# Patient Record
Sex: Female | Born: 1960 | Race: White | Hispanic: No | Marital: Married | Smoking: Never smoker
Health system: Southern US, Community
[De-identification: ages and names within clinical notes are randomized; demographics above are authoritative.]

## PROBLEM LIST (undated history)

## (undated) DIAGNOSIS — O1490 Unspecified pre-eclampsia, unspecified trimester: Secondary | ICD-10-CM

## (undated) HISTORY — PX: ABDOMINAL HYSTERECTOMY: SHX81

---

## 1999-10-13 ENCOUNTER — Other Ambulatory Visit: Admission: RE | Admit: 1999-10-13 | Discharge: 1999-10-13 | Payer: Self-pay | Admitting: Gynecology

## 1999-10-13 ENCOUNTER — Encounter: Payer: Self-pay | Admitting: Gynecology

## 1999-10-13 ENCOUNTER — Encounter: Admission: RE | Admit: 1999-10-13 | Discharge: 1999-10-13 | Payer: Self-pay | Admitting: Gynecology

## 2002-08-23 ENCOUNTER — Encounter: Admission: RE | Admit: 2002-08-23 | Discharge: 2002-08-23 | Payer: Self-pay | Admitting: Gynecology

## 2002-08-23 ENCOUNTER — Other Ambulatory Visit: Admission: RE | Admit: 2002-08-23 | Discharge: 2002-08-23 | Payer: Self-pay | Admitting: Gynecology

## 2002-08-23 ENCOUNTER — Encounter: Payer: Self-pay | Admitting: Gynecology

## 2005-01-27 ENCOUNTER — Ambulatory Visit: Payer: Self-pay | Admitting: Pulmonary Disease

## 2005-02-11 ENCOUNTER — Ambulatory Visit: Payer: Self-pay | Admitting: Internal Medicine

## 2005-03-01 ENCOUNTER — Ambulatory Visit: Payer: Self-pay | Admitting: Pulmonary Disease

## 2005-03-09 ENCOUNTER — Ambulatory Visit: Payer: Self-pay | Admitting: Gastroenterology

## 2005-07-20 ENCOUNTER — Other Ambulatory Visit: Admission: RE | Admit: 2005-07-20 | Discharge: 2005-07-20 | Payer: Self-pay | Admitting: Gynecology

## 2005-07-29 ENCOUNTER — Encounter: Admission: RE | Admit: 2005-07-29 | Discharge: 2005-07-29 | Payer: Self-pay | Admitting: Gynecology

## 2005-09-28 ENCOUNTER — Ambulatory Visit: Payer: Self-pay | Admitting: Pulmonary Disease

## 2006-11-29 ENCOUNTER — Ambulatory Visit: Payer: Self-pay | Admitting: Pulmonary Disease

## 2007-06-30 ENCOUNTER — Telehealth (INDEPENDENT_AMBULATORY_CARE_PROVIDER_SITE_OTHER): Payer: Self-pay | Admitting: *Deleted

## 2007-09-14 ENCOUNTER — Ambulatory Visit: Payer: Self-pay | Admitting: Pulmonary Disease

## 2007-09-14 DIAGNOSIS — F411 Generalized anxiety disorder: Secondary | ICD-10-CM | POA: Insufficient documentation

## 2007-09-14 DIAGNOSIS — IMO0001 Reserved for inherently not codable concepts without codable children: Secondary | ICD-10-CM

## 2007-09-14 DIAGNOSIS — K649 Unspecified hemorrhoids: Secondary | ICD-10-CM | POA: Insufficient documentation

## 2007-09-14 DIAGNOSIS — K589 Irritable bowel syndrome without diarrhea: Secondary | ICD-10-CM

## 2007-09-28 ENCOUNTER — Telehealth: Payer: Self-pay | Admitting: Adult Health

## 2007-10-05 ENCOUNTER — Ambulatory Visit: Payer: Self-pay | Admitting: Pulmonary Disease

## 2007-10-05 ENCOUNTER — Encounter: Payer: Self-pay | Admitting: Adult Health

## 2007-10-17 ENCOUNTER — Ambulatory Visit: Payer: Self-pay | Admitting: Pulmonary Disease

## 2007-10-17 ENCOUNTER — Encounter: Payer: Self-pay | Admitting: Adult Health

## 2007-10-17 DIAGNOSIS — G4700 Insomnia, unspecified: Secondary | ICD-10-CM | POA: Insufficient documentation

## 2007-10-20 LAB — CONVERTED CEMR LAB
ALT: 16 units/L (ref 0–35)
AST: 18 units/L (ref 0–37)
Albumin: 3.5 g/dL (ref 3.5–5.2)
Alkaline Phosphatase: 54 units/L (ref 39–117)
BUN: 12 mg/dL (ref 6–23)
Bacteria, UA: NEGATIVE
Basophils Absolute: 0 10*3/uL (ref 0.0–0.1)
Basophils Relative: 0.1 % (ref 0.0–1.0)
Bilirubin, Direct: 0.1 mg/dL (ref 0.0–0.3)
CO2: 29 meq/L (ref 19–32)
Calcium: 9 mg/dL (ref 8.4–10.5)
Chloride: 109 meq/L (ref 96–112)
Creatinine, Ser: 0.8 mg/dL (ref 0.4–1.2)
Crystals: NEGATIVE
Eosinophils Absolute: 0.1 10*3/uL (ref 0.0–0.7)
Eosinophils Relative: 1.5 % (ref 0.0–5.0)
Folate: 7.1 ng/mL
GFR calc Af Amer: 99 mL/min
GFR calc non Af Amer: 82 mL/min
Glucose, Bld: 93 mg/dL (ref 70–99)
HCT: 37.4 % (ref 36.0–46.0)
Hemoglobin, Urine: NEGATIVE
Hemoglobin: 13 g/dL (ref 12.0–15.0)
Iron: 85 ug/dL (ref 42–145)
Ketones, ur: NEGATIVE mg/dL
Leukocytes, UA: NEGATIVE
Lymphocytes Relative: 38.1 % (ref 12.0–46.0)
MCHC: 34.8 g/dL (ref 30.0–36.0)
MCV: 91.2 fL (ref 78.0–100.0)
Monocytes Absolute: 0.5 10*3/uL (ref 0.1–1.0)
Monocytes Relative: 6.5 % (ref 3.0–12.0)
Mucus, UA: NEGATIVE
Neutro Abs: 3.8 10*3/uL (ref 1.4–7.7)
Neutrophils Relative %: 53.8 % (ref 43.0–77.0)
Nitrite: NEGATIVE
Platelets: 404 10*3/uL — ABNORMAL HIGH (ref 150–400)
Potassium: 3.9 meq/L (ref 3.5–5.1)
RBC / HPF: NONE SEEN
RBC: 4.1 M/uL (ref 3.87–5.11)
RDW: 12.9 % (ref 11.5–14.6)
Saturation Ratios: 23.8 % (ref 20.0–50.0)
Sodium: 141 meq/L (ref 135–145)
Specific Gravity, Urine: >=1.03 (ref 1.000–1.03)
TSH: 5.34 microintl units/mL (ref 0.35–5.50)
Total Bilirubin: 0.6 mg/dL (ref 0.3–1.2)
Total Protein, Urine: NEGATIVE mg/dL
Total Protein: 6.8 g/dL (ref 6.0–8.3)
Transferrin: 255.1 mg/dL (ref >=212.0)
Urine Glucose: NEGATIVE mg/dL
Urobilinogen, UA: 0.2 (ref 0.0–1.0)
Vitamin B-12: 264 pg/mL (ref 211–911)
WBC: 7.1 10*3/uL (ref 4.5–10.5)
pH: 5.5 (ref 5.0–8.0)

## 2008-02-16 ENCOUNTER — Ambulatory Visit: Payer: Self-pay | Admitting: Pulmonary Disease

## 2008-02-18 DIAGNOSIS — N809 Endometriosis, unspecified: Secondary | ICD-10-CM | POA: Insufficient documentation

## 2008-02-18 DIAGNOSIS — J45991 Cough variant asthma: Secondary | ICD-10-CM

## 2008-02-18 DIAGNOSIS — E669 Obesity, unspecified: Secondary | ICD-10-CM

## 2008-02-18 DIAGNOSIS — E559 Vitamin D deficiency, unspecified: Secondary | ICD-10-CM | POA: Insufficient documentation

## 2008-02-18 DIAGNOSIS — J309 Allergic rhinitis, unspecified: Secondary | ICD-10-CM | POA: Insufficient documentation

## 2008-02-18 DIAGNOSIS — J189 Pneumonia, unspecified organism: Secondary | ICD-10-CM

## 2008-05-21 ENCOUNTER — Telehealth (INDEPENDENT_AMBULATORY_CARE_PROVIDER_SITE_OTHER): Payer: Self-pay | Admitting: *Deleted

## 2008-05-22 ENCOUNTER — Ambulatory Visit: Payer: Self-pay | Admitting: Pulmonary Disease

## 2008-05-27 ENCOUNTER — Telehealth (INDEPENDENT_AMBULATORY_CARE_PROVIDER_SITE_OTHER): Payer: Self-pay | Admitting: *Deleted

## 2008-05-27 ENCOUNTER — Ambulatory Visit: Payer: Self-pay | Admitting: Internal Medicine

## 2008-06-11 ENCOUNTER — Telehealth: Payer: Self-pay | Admitting: Pulmonary Disease

## 2008-06-24 ENCOUNTER — Telehealth: Payer: Self-pay | Admitting: Pulmonary Disease

## 2008-06-27 ENCOUNTER — Encounter: Payer: Self-pay | Admitting: Pulmonary Disease

## 2008-07-11 ENCOUNTER — Encounter: Admission: RE | Admit: 2008-07-11 | Discharge: 2008-07-11 | Payer: Self-pay | Admitting: Gynecology

## 2008-07-16 ENCOUNTER — Encounter: Payer: Self-pay | Admitting: Pulmonary Disease

## 2008-08-15 ENCOUNTER — Telehealth (INDEPENDENT_AMBULATORY_CARE_PROVIDER_SITE_OTHER): Payer: Self-pay | Admitting: *Deleted

## 2008-12-06 ENCOUNTER — Telehealth (INDEPENDENT_AMBULATORY_CARE_PROVIDER_SITE_OTHER): Payer: Self-pay | Admitting: *Deleted

## 2008-12-06 DIAGNOSIS — L659 Nonscarring hair loss, unspecified: Secondary | ICD-10-CM | POA: Insufficient documentation

## 2008-12-10 ENCOUNTER — Ambulatory Visit: Payer: Self-pay | Admitting: Pulmonary Disease

## 2008-12-16 ENCOUNTER — Telehealth: Payer: Self-pay | Admitting: Pulmonary Disease

## 2008-12-17 LAB — CONVERTED CEMR LAB
ALT: 14 units/L (ref 0–35)
AST: 20 units/L (ref 0–37)
Albumin: 3.4 g/dL — ABNORMAL LOW (ref 3.5–5.2)
Alkaline Phosphatase: 59 units/L (ref 39–117)
BUN: 11 mg/dL (ref 6–23)
Basophils Absolute: 0.1 10*3/uL (ref 0.0–0.1)
Basophils Relative: 0.6 % (ref 0.0–3.0)
Bilirubin, Direct: 0.1 mg/dL (ref 0.0–0.3)
CO2: 27 meq/L (ref 19–32)
Calcium: 8.8 mg/dL (ref 8.4–10.5)
Chloride: 108 meq/L (ref 96–112)
Cholesterol: 188 mg/dL (ref 0–200)
Creatinine, Ser: 0.8 mg/dL (ref 0.4–1.2)
Eosinophils Absolute: 0.1 10*3/uL (ref 0.0–0.7)
Eosinophils Relative: 1.1 % (ref 0.0–5.0)
GFR calc non Af Amer: 81.58 mL/min (ref >60)
Glucose, Bld: 94 mg/dL (ref 70–99)
HCT: 35.2 % — ABNORMAL LOW (ref 36.0–46.0)
HDL: 44.7 mg/dL (ref >39.00)
Hemoglobin: 12.2 g/dL (ref 12.0–15.0)
LDL Cholesterol: 127 mg/dL — ABNORMAL HIGH (ref 0–99)
Lymphocytes Relative: 25.6 % (ref 12.0–46.0)
Lymphs Abs: 2.2 10*3/uL (ref 0.7–4.0)
MCHC: 34.7 g/dL (ref 30.0–36.0)
MCV: 91.7 fL (ref 78.0–100.0)
Monocytes Absolute: 0.5 10*3/uL (ref 0.1–1.0)
Monocytes Relative: 5.6 % (ref 3.0–12.0)
Neutro Abs: 5.6 10*3/uL (ref 1.4–7.7)
Neutrophils Relative %: 67.1 % (ref 43.0–77.0)
Platelets: 353 10*3/uL (ref 150.0–400.0)
Potassium: 3.9 meq/L (ref 3.5–5.1)
RBC: 3.84 M/uL — ABNORMAL LOW (ref 3.87–5.11)
RDW: 12.1 % (ref 11.5–14.6)
Sed Rate: 25 mm/hr — ABNORMAL HIGH (ref 0–22)
Sodium: 143 meq/L (ref 135–145)
TSH: 3.51 microintl units/mL (ref 0.35–5.50)
Total Bilirubin: 0.6 mg/dL (ref 0.3–1.2)
Total CHOL/HDL Ratio: 4
Total Protein: 6.9 g/dL (ref 6.0–8.3)
Triglycerides: 84 mg/dL (ref 0.0–149.0)
VLDL: 16.8 mg/dL (ref 0.0–40.0)
Vit D, 25-Hydroxy: 19 ng/mL — ABNORMAL LOW (ref 30–89)
Vitamin B-12: 247 pg/mL (ref 211–911)
WBC: 8.5 10*3/uL (ref 4.5–10.5)

## 2009-03-10 ENCOUNTER — Ambulatory Visit: Payer: Self-pay | Admitting: Pulmonary Disease

## 2009-07-09 ENCOUNTER — Ambulatory Visit: Payer: Self-pay | Admitting: Pulmonary Disease

## 2009-08-13 ENCOUNTER — Telehealth (INDEPENDENT_AMBULATORY_CARE_PROVIDER_SITE_OTHER): Payer: Self-pay | Admitting: *Deleted

## 2009-08-14 ENCOUNTER — Telehealth (INDEPENDENT_AMBULATORY_CARE_PROVIDER_SITE_OTHER): Payer: Self-pay | Admitting: *Deleted

## 2010-01-21 ENCOUNTER — Telehealth (INDEPENDENT_AMBULATORY_CARE_PROVIDER_SITE_OTHER): Payer: Self-pay | Admitting: *Deleted

## 2010-01-22 ENCOUNTER — Ambulatory Visit: Payer: Self-pay | Admitting: Internal Medicine

## 2010-01-22 ENCOUNTER — Ambulatory Visit: Payer: Self-pay | Admitting: Cardiovascular Disease

## 2010-01-22 ENCOUNTER — Telehealth: Payer: Self-pay | Admitting: Gastroenterology

## 2010-01-22 DIAGNOSIS — K648 Other hemorrhoids: Secondary | ICD-10-CM | POA: Insufficient documentation

## 2010-01-22 DIAGNOSIS — K644 Residual hemorrhoidal skin tags: Secondary | ICD-10-CM | POA: Insufficient documentation

## 2010-01-22 DIAGNOSIS — R1032 Left lower quadrant pain: Secondary | ICD-10-CM

## 2010-01-22 LAB — CONVERTED CEMR LAB
Basophils Relative: 0.4 % (ref 0.0–3.0)
Eosinophils Relative: 1.1 % (ref 0.0–5.0)
HCT: 39.4 % (ref 36.0–46.0)
Hemoglobin, Urine: NEGATIVE
Lymphs Abs: 3.2 10*3/uL (ref 0.7–4.0)
MCV: 92.3 fL (ref 78.0–100.0)
Monocytes Absolute: 0.6 10*3/uL (ref 0.1–1.0)
Monocytes Relative: 4.9 % (ref 3.0–12.0)
Neutrophils Relative %: 65.3 % (ref 43.0–77.0)
Nitrite: NEGATIVE
Platelets: 371 10*3/uL (ref 150.0–400.0)
RBC: 4.27 M/uL (ref 3.87–5.11)
Total Protein, Urine: NEGATIVE mg/dL
Urine Glucose: NEGATIVE mg/dL
WBC: 11.4 10*3/uL — ABNORMAL HIGH (ref 4.5–10.5)
pH: 5.5 (ref 5.0–8.0)

## 2010-02-25 ENCOUNTER — Encounter: Admission: RE | Admit: 2010-02-25 | Discharge: 2010-02-25 | Payer: Self-pay | Admitting: Gynecology

## 2010-06-03 ENCOUNTER — Telehealth (INDEPENDENT_AMBULATORY_CARE_PROVIDER_SITE_OTHER): Payer: Self-pay | Admitting: *Deleted

## 2010-06-05 ENCOUNTER — Telehealth (INDEPENDENT_AMBULATORY_CARE_PROVIDER_SITE_OTHER): Payer: Self-pay | Admitting: *Deleted

## 2010-07-17 ENCOUNTER — Telehealth: Payer: Self-pay | Admitting: Pulmonary Disease

## 2010-07-21 ENCOUNTER — Encounter: Payer: Self-pay | Admitting: Pulmonary Disease

## 2010-07-21 NOTE — Assessment & Plan Note (Signed)
Summary: ROV W/ LABS ///KP   CC:  4 month ROV & review of medical issues....  History of Present Illness: 50 y/o WF here for a follow up visit...    ~  Aug09:  she is the daughter of Lennon Alstrom & the wife of Shyna Duignan.... she has Asthma & AB w/ persist cough and saw TP in Apr09 w/ anti-reflux measures, PPI, cough suppressants perscribed... she improved and has once again stopped her maintenance therapy... she still notes cough when she takes a deep breath etc... Symbicort160 2spBid started & she was told to take it regularly every day!  ~  Dec09:  3 month ROV & add-on for 1 week hx cough, "I have a cold"- denies sputum, f/c/s... but notes nasal congestion and a tickle in her throat... states coughing day & night, denies GERD & not on PPI Rx... SHE STOPPED HER SYMBICORT AGAIN- "I use it on & off- take a hit when I need it"... we again discussed the rationale behind controller medication for her asthma!!!  We will Rx this exas w/ Depo80, Dosepak, restart Symbicort regularly, add Singulair... she also takes Zyrtek, and we will add Mucinex OTC...  ~  Jan10:  she had allergy eval DrESL- neg skin tests & Rx w/ saline, Patanase, Omnaris, etc... also saw DrMims, ENT @ [facility] w/ laryngoscopy showing lingual tonsils, some edema in the posterior commisure area- he rec Nexium 40mg Bid for reflux related throat symptoms...   ~  March 10, 2009:  she states that she stopped everything & has actually been doing fairly well... on new [employer] and weight down 20# feeling better... using nasal saline Prn... had labs done 6/10 & here to review w/ rec for vits... Vit D= 19, & Vit B12= 247 >> rec taking supplements daily & f/u levels in 21mo...   ~  July 09, 2009:  Again she didn't stick w/ the Vit supplements thinking they would make her gain wt... weight is actually down another 13+ lbs... we discussed the prev recommendations>> take the Vit D 06-1998 u daily & the Vit B12 1000 micrograms/d w/ ROV &  recheck levels in 21mo... also requesting sleeping pill...   Current Problem List:  ALLERGIC RHINITIS (ICD-477.9) - she is INTOL to Claritin (sleepy), Pseudophed (hyper), Codeine (itching), Delsym (knocks me out)... she uses Mucinex & Saline Prn...  ~  1/10:  allergy eval DrESL w/ skin testing essent neg & Rx w/ Omnaris, Patanase, Allegra w/o help.  COUGH VARIANT ASTHMA (ICD-493.82) - as noted above: long hx of non-compliance w/ medical regimen... Baseline CXR is clear... PFT's Apr09 showed FVC= 3.31 (101%), FEV1= 2.23 (82%), %1sec= 68, mid-flows= 32%predicted... we discussed the NEED for a controller drug and again perscribed SYMBICORT 160, but she won't take regular meds...  ~  1/10:  ENT eval at [facility] w/ LER suspected and Rx w/ Nexium 40Bid but she stopped this on her own.  Hx of PNEUMONIA (ICD-486) - Dx'd w/ LLL pneumonia earlier this yr at [facility]... CXR 09/14/07 here was clear, NAD...  OBESITY (ICD-278.00) - her weight has been in the 250's x yrs... we discussed diet + exercise therapy...  ~  weight 12/09 = 257#  ~  weight 9/10 = 238# on the new [employer]...  ~  weight 1/11 = 225#... keep up the good work.  IRRITABLE BOWEL SYNDROME (ICD-564.1) & HEMORRHOIDS (ICD-455.6) - intermittent c/o abd cramps etc... eval 2006 for rectal bleeding- found secondary to hems and referred to gen  Careers adviser...  ~  colonoscopy 9/06 by DrJacobs was neg x hemorroids...  Hx of ENDOMETRIOSIS (ICD-617.9) - s/p TAH & excision of mult endometriosis implants by DrLomax 11/98...  FIBROMYALGIA (ICD-729.1) - she was seeing chiropractor for Fibro treatment but had to stop due to $$$... she saw Rheum= DrDeveshwar w/ rec for Trazodone, Lidoderm, & FM program...  VITAMIN D DEFICIENCY (ICD-268.9) -   ~  Vit D level 4/09 was 14, therefore started on Vit D 50,000 u weekly... only took for 1 month & we discussed need for long term Rx.  ~  labs 6/10 showed Vit D = 19... rec> 2000 u OTC daily long term...  ? of  VITAMIN B12 DEFICIENCY (ICD-266.2) - Vit B 12 level has been low normal range- but no macrocytic anemia and she diets intermittently... we discussed taking oral B12 supplement x 86mo w/ repeat level to assess response...  ~  labs 4/09 showed B12 level = 264... rec> start OTC Vit B12  ~517mcg/d, but she did't stick w/ it...  ~  labs 6/10 showed B12 level = 247... rec> start Vit B12 daily...  ANXIETY (ICD-300.00) & INSOMNIA (ICD-780.52) - not currently on meds... tried the Trazodone, but this didn't work>> rec to try AMBIEN 10mg  Prn.    Allergies: 1)  ! Morphine 2)  ! Penicillin 3)  ! Codeine  Past History:  Past Medical History:  ALLERGIC RHINITIS (ICD-477.9) COUGH VARIANT ASTHMA (ICD-493.82) Hx of PNEUMONIA (ICD-486) OBESITY (ICD-278.00) IRRITABLE BOWEL SYNDROME (ICD-564.1) HEMORRHOIDS (ICD-455.6) Hx of ENDOMETRIOSIS (ICD-617.9) FIBROMYALGIA (ICD-729.1) VITAMIN D DEFICIENCY (ICD-268.9) ? of VITAMIN B12 DEFICIENCY (ICD-266.2) ANXIETY (ICD-300.00) INSOMNIA (ICD-780.52)  Past Surgical History: S/P TAH in 1998 for endometriosis  Family History: Reviewed history and no changes required. Father - Alive -Age 52 -Emphysema, Heart Disease, Arthritis Mother - Alive -Age 22 - asthma, Arthritis PGF - Prostate, Colon CA (?) Brother - Alive Age 38 Sister - Alive Age 73  Social History: Reviewed history from 03/10/2009 and no changes required. Married - husb Sonny [place] 3 children (girls) Non smoker Social alcohol Business owner  Review of Systems      See HPI       The patient complains of dyspnea on exertion.  The patient denies anorexia, fever, weight loss, weight gain, vision loss, decreased hearing, hoarseness, chest pain, syncope, peripheral edema, prolonged cough, headaches, hemoptysis, abdominal pain, melena, hematochezia, severe indigestion/heartburn, hematuria, incontinence, muscle weakness, suspicious skin lesions, transient blindness, difficulty walking,  depression, unusual weight change, abnormal bleeding, enlarged lymph nodes, and angioedema.    Vital Signs:  Patient profile:   50 year old female Height:      64 inches Weight:      225 pounds BMI:     38.76 O2 Sat:      96 % on Room air Temp:     98.9 degrees F oral Pulse rate:   60 / minute BP sitting:   136 / 88  (right arm)  Vitals Entered By: Randell Loop CMA (July 09, 2009 9:33 AM)  O2 Flow:  Room air  Physical Exam  Additional Exam:  WD, Obese, 50 y/o WF in NAD... GENERAL:  Alert & oriented; pleasant & cooperative... HEENT:  [place]/AT, EOM-wnl, PERRLA, EACs-clear, TMs-wnl, NOSE-pale, max. tenderness, THROAT-clear & wnl. NECK:  Supple w/ full ROM; no JVD; normal carotid impulses w/o bruits; no thyromegaly or nodules palpated; no lymphadenopathy. CHEST:  Clear to P & A- no wheezing, rales, or rhonchi heard... HEART:  Regular Rhythm; without murmurs/ rubs/  or gallops detected... ABDOMEN:  Obese, soft & nontender; normal bowel sounds; no organomegaly or masses palpated... EXT: without deformities or arthritic changes; no varicose veins/ venous insuffic/ or edema. NEURO:  no focal neuro deficits... DERM:  no lesions or rash...     Impression & Recommendations:  Problem # 1:  ALLERGIC RHINITIS (ICD-477.9) Stable-  symptoms under control...  Problem # 2:  COUGH VARIANT ASTHMA (ICD-493.82) No recent problem w/ cough, congestion,  etc... she won't take meds regularly.  Problem # 3:  OBESITY (ICD-278.00) On diet + exercise program... keep up the good work.  Problem # 4:  FIBROMYALGIA (ICD-729.1) Aware-  notes difficulty resting & we will try AMBIEN.  Problem # 5:  VITAMIN D DEFICIENCY (ICD-268.9) Discussed taking Vit d 06-1998 u daily on a regular basis for 6 months-  then recheck level.  Problem # 6:  ? of VITAMIN B12 DEFICIENCY (ICD-266.2) Still w/ fad diets... rec taking Vit B12 1000 micrograms daily for 6 mo w/ repeat B12 level... if still low check IF antibody vs  Schillings test and Rx w/ B12 shots...  Complete Medication List: 1)  Womens Multivitamin Plus Tabs (Multiple vitamins-minerals) .... Take 1 tabpo once daily.Marland KitchenMarland Kitchen 2)  Vitamin B-12 1000 Mcg Tabs (Cyanocobalamin) .... Take 1 tab by mouth once daily.Marland KitchenMarland Kitchen 3)  Vitamin D 2000 Unit Tabs (Cholecalciferol) .... Take 1 tab by mouth once daily.Marland KitchenMarland Kitchen 4)  Ambien 10 Mg Tabs (Zolpidem tartrate) .... Take 1 tab by mouth at bedtime as needed for sleep...  Other Orders: Prescription Created Electronically [phone])  Patient Instructions: 1)  Today we updated your med list- see below.... 2)  We discussed taking the Vit D & Vit B12 tabs regularly for 6 months, then repeating the blood levels... 3)  We also wrote a new perscription for AMBIEN to try for sleep.Marland KitchenMarland Kitchen 4)  Keep up the good work w/ diet + exercise... 5)  Call for any problems.Marland KitchenMarland Kitchen 6)  Let's plan a follow up visit in 6 mo w/ FASTING blood work... Prescriptions: AMBIEN 10 MG TABS (ZOLPIDEM TARTRATE) take 1 tab by mouth at bedtime as needed for sleep...  #30 x prn   Entered and Authorized by:   Michele Mcalpine MD   Signed by:   Michele Mcalpine MD on 07/09/2009   Method used:   Print then Give to Patient   RxID:   [id number]

## 2010-07-21 NOTE — Progress Notes (Signed)
Summary: Triage  Phone Note From Other Clinic   Caller: [facility] @ Dr. Lennon Alstrom [phone] Call For: Dr. Christella Hartigan Summary of Call: left lower quadrant pain x 2 days, diarrhea and abd cramping....requesting pt. be seen ASAP Initial call taken by: Karna Christmas,  January 22, 2010 9:09 AM  Follow-up for Phone Call        LLQ pain, diarrhea x 2 days, Bowel movements and passing gas does not help.  She has been taking Miralax, Benefiber and Prunes until the diarrhea started.  She has also complained of weight loss.  Dr Kriste Basque gave the pt Bentyl yesterday and advised the pt to see Dr Christella Hartigan asap. Appt given with Amy today Almyra Free will advise pt Follow-up by: Chales Abrahams CMA Duncan Dull),  January 22, 2010 9:21 AM

## 2010-07-21 NOTE — Procedures (Signed)
Summary: [facility] COLON   Colonoscopy  Procedure date:  03/09/2005  Findings:      Location:  [facility].    Procedures Next Due Date:    Colonoscopy: 03/2015 Patient Name: Jackie Boone, Jackie Boone MRN:  Procedure Procedures: Colonoscopy CPT: 16109.  Personnel: Endoscopist: Rachael Fee, MD.  Exam Location: Exam performed in Outpatient Clinic. Outpatient  Patient Consent: Procedure, Alternatives, Risks and Benefits discussed, consent obtained, from patient. Consent was obtained by the RN.  Indications Symptoms: Hematochezia.  History  Current Medications: Patient is not currently taking Coumadin.  Pre-Exam Physical: Performed Mar 09, 2005. Cardio-pulmonary exam, Abdominal exam, Mental status exam WNL.  Exam Exam: Extent of exam reached: Cecum, extent intended: Cecum.  The cecum was identified by appendiceal orifice and IC valve. Patient position: on left side. Colon retroflexion performed. Images taken. ASA Classification: II. Tolerance: good.  Monitoring: Pulse and BP monitoring, Oximetry used. Supplemental O2 given.  Sedation Meds: Patient assessed and found to be appropriate for moderate (conscious) sedation. Fentanyl 100 mcg. given IV. Versed 10 mg. given IV.  Findings - NORMAL EXAM: Cecum to Rectum. Comments: Otherwise normal examination.  - HEMORRHOIDS: Internal and External. Size: Medium. ICD9: Hemorrhoids, Internal and  External: 455.6.   Assessment Abnormal examination, see findings above.  Diagnoses: 455.6: Hemorrhoids, Internal and  External.   Comments: The hemorrhoids are the likely source of her intermittent rectal bleeding.  If bleeding becomes significant, she will need evaluation by general surgeon. Events  Unplanned Interventions: No intervention was required.  Unplanned Events: There were no complications. Plans Comments: Average risk colorectal cancer screening. Disposition: After procedure patient sent to recovery.  After recovery patient sent home.  Comments: Repeat colonoscopy in 10 years.  cc: Lonzo Cloud. Kriste Basque  This report was created from the original endoscopy report, which was reviewed and signed by the above listed endoscopist.

## 2010-07-21 NOTE — Progress Notes (Signed)
Summary: talk to nurse---abd cramping  Phone Note Call from Patient Call back at [phone]   Caller: Patient Call For: nadel Summary of Call: pain in lower left side. need to talk to nurse Initial call taken by: Rickard Patience,  January 21, 2010 10:08 AM  Follow-up for Phone Call        [facility] x 1 Zackery Barefoot [facility]  January 21, 2010 10:20 AM   Returning call.Darletta Moll  January 21, 2010 10:50 AM  Pt c/o front left lower quadrant pain x 2 days worse last PM. Intermittent sharp pain, "feels like IBS but doesn't stay gone after the diarrhea", passing gas. Pt has tried Miralax, Benefiber, and prunes until diarrhea started. Pt called "nurse line" last night due to extreme pain and was advised to take 4 Aleve and call MD in the AM. Pt states this only helped a little. Pt did not want to go to the ER last night because SN would not be coming in to see her. Please advise. Thanks Zackery Barefoot CMA  January 21, 2010 12:21 PM  Allergies:  1)  ! Morphine 2)  ! Penicillin 3)  ! Codeine   Additional Follow-up for Phone Call Additional follow up Details #1::        per SN---bentyl 20mg   #50  1 by mouth every 4 hours as needed for abd cramping---asap appt with GI---Dr. Candie Mile has already been put in the computer for the pt Randell Loop CMA  January 21, 2010 5:05 PM     Additional Follow-up for Phone Call Additional follow up Details #2::    called and spoke with pt.  pt aware of SN's recs.  rx sent to pharmacy and someone will be calling her regarding GI appt.  Aundra Millet Reynolds LPN  January 21, 2010 5:09 PM   New/Updated Medications: BENTYL 20 MG TABS (DICYCLOMINE HCL) take 1 tab by mouth every 4 hours as needed for abdominal cramping Prescriptions: BENTYL 20 MG TABS (DICYCLOMINE HCL) take 1 tab by mouth every 4 hours as needed for abdominal cramping  #50 x 0   Entered by:   Arman Filter LPN   Authorized by:   Michele Mcalpine MD   Signed by:   Arman Filter LPN on 45/40/9811  Method used:   Electronically to        [facility]* (retail)       [address] [place], [place]  [address]       Ph: [phone] or [phone]       Fax: [phone]   RxID:   [id number]

## 2010-07-21 NOTE — Progress Notes (Signed)
Summary: rx  Phone Note Call from Patient Call back at Home Phone [phone] Call back at [phone]   Caller: Patient Call For: nadel Reason for Call: Talk to Nurse Summary of Call: pt was given Avelox, making her gassy and cramping(which is a side affect).  Can you call in something else for pt? [facility] Initial call taken by: Eugene Gavia,  August 14, 2009 3:02 PM  Follow-up for Phone Call        causing severe abd cramping--took the last 2 days and it caused the cramping today---feels some nausea today--- Randell Loop CMA  August 14, 2009 3:14 PM    per SN---stop the avelox and change to [place]  generic is ok  300mg   #14  2 by mouth once daily until gone.  this has been sent to the pts pharmacy and pt is aware Randell Loop CMA  August 14, 2009 4:44 PM     New/Updated Medications: CEFDINIR 300 MG CAPS (CEFDINIR) take 2 tablets by mouth once daily until gone Prescriptions: CEFDINIR 300 MG CAPS (CEFDINIR) take 2 tablets by mouth once daily until gone  #14 x 0   Entered by:   Randell Loop CMA   Authorized by:   Michele Mcalpine MD   Signed by:   Randell Loop CMA on 08/14/2009   Method used:   Electronically to        [facility]* (retail)       [address] [place], [place]  [address]       Ph: [phone] or [phone]       Fax: [phone]   RxID:   [id number]

## 2010-07-21 NOTE — Assessment & Plan Note (Signed)
Summary: abd pain and diarrhea x 2 days /pl              jacobs pt   History of Present Illness Visit Type: Initial Consult Primary GI MD: Rob Bunting MD Primary Provider: Alroy Dust, MD Requesting Provider: Alroy Dust, MD Chief Complaint: Patient c/o  2 days LLQ abdominal pain and lower abdominal cramping. She also  c/o some nausea but thinks it is related to her pain. She has some fullness in the abdomen as well. She has noticed and increase in gas as well as smaller caliber stools than normal. History of Present Illness:   50 Y.O FEMALE KNOWN TO DR. Christella Hartigan  FROM COLONOSCOPY IN 2006 WHICH WAS NORMAL EXCEPT FOR HEMORRHOIDS. SHE IS S/P TAH IN 98 FOR ENDOMETRIOSIS.  SHE COMES INTODAY WITH C/O ACUTE LLQ PAIN,ABRUPT ONSET,STABBING IN NATURE,LASTED ABOUT 30 MINUTES ON 8/2 THEN EASED OFF SHE WOKE UP THE FOLLOWING MORNING WITH RECURRENT PAIN WHICH HAS BEEN PERSISTENT SINCE. NO RADIATION. SHE HAS HAD SOME VAGUE NAUSEA THAT SHE FEELS IS FROM PAIN. NO FVER. NO DYSURIA. SHE FEELS BLOATED ,HAS HAD SOME MILD CONSTIPATION.,INCREASED GAS. SHE FEELS A PULLING SENSATION IN HER PELVIS.    GI Review of Systems    Reports abdominal pain, bloating, and  nausea.     Location of  Abdominal pain: LLQ.    Denies acid reflux, belching, chest pain, dysphagia with liquids, dysphagia with solids, heartburn, loss of appetite, vomiting, vomiting blood, and  weight loss.      Reports constipation.     Denies anal fissure, black tarry stools, change in bowel habit, diarrhea, diverticulosis, fecal incontinence, heme positive stool, hemorrhoids, irritable bowel syndrome, jaundice, light color stool, liver problems, rectal bleeding, and  rectal pain. Preventive Screening-Counseling & Management  Caffeine-Diet-Exercise     Does Patient Exercise: no      Drug Use:  no.      Current Medications (verified): 1)  Ambien 10 Mg Tabs (Zolpidem Tartrate) .... Take 1 Tab By Mouth At Bedtime As Needed For Sleep.Marland KitchenMarland Kitchen 2)  Bentyl  20 Mg Tabs (Dicyclomine Hcl) .... Take 1 Tab By Mouth Every 4 Hours As Needed For Abdominal Cramping  Allergies (verified): 1)  ! Morphine 2)  ! Penicillin 3)  ! Codeine  Past History:  Past Medical History:  ALLERGIC RHINITIS (ICD-477.9) COUGH VARIANT ASTHMA (ICD-493.82) Hx of PNEUMONIA (ICD-486) OBESITY (ICD-278.00) IRRITABLE BOWEL SYNDROME (ICD-564.1) HEMORRHOIDS (ICD-455.6) Hx of ENDOMETRIOSIS (ICD-617.9) FIBROMYALGIA (ICD-729.1) VITAMIN D DEFICIENCY (ICD-268.9)  ANXIETY (ICD-300.00) INSOMNIA (ICD-780.52)  Past Surgical History: S/P TAH in 1998 for endometriosis  Family History: Father - Alive -Age 5 -Emphysema, Heart Disease, Arthritis Mother - Alive -Age 63 - asthma, Arthritis PGF - Prostate, Colon CA (?) Brother - Alive Age 49 Sister - Alive Age 76 Family History of Crohn's:Aunt Family History of Stomach Cancer: Aunt Family History of Diabetes: Aunt  Social History: Married - husb Sonny [place] 3 children (girls) Non Development worker, community Alcohol Use - yes-socially Daily Caffeine Use Illicit Drug Use - no Patient does not get regular exercise.  Drug Use:  no Does Patient Exercise:  no  Review of Systems       The patient complains of arthritis/joint pain, fatigue, muscle pains/cramps, night sweats, swelling of feet/legs, urine leakage, and vision changes.  The patient denies allergy/sinus, anemia, anxiety-new, back pain, blood in urine, breast changes/lumps, change in vision, confusion, cough, coughing up blood, depression-new, fainting, fever, headaches-new, hearing problems, heart murmur, heart rhythm changes, itching, menstrual pain,  nosebleeds, pregnancy symptoms, shortness of breath, skin rash, sleeping problems, sore throat, swollen lymph glands, thirst - excessive , urination - excessive , urination changes/pain, and voice change.         SEE HPI  Vital Signs:  Patient profile:   50 year old female Height:      64 inches Weight:       229.13 pounds BMI:     39.47 BSA:     2.07 Temp:     98.3 degrees F oral Pulse rate:   56 / minute Pulse rhythm:   regular BP sitting:   146 / 88  (left arm) Cuff size:   large  Vitals Entered By: Lamona Curl CMA Duncan Dull) (January 22, 2010 2:03 PM)  Physical Exam  General:  Well developed, well nourished, no acute distress. Head:  Normocephalic and atraumatic. Eyes:  PERRLA, no icterus. Lungs:  Clear throughout to auscultation. Heart:  Regular rate and rhythm; no murmurs, rubs,  or bruits. Abdomen:  SOFT, TENDER IN LLQ,NO REBOUND, NOP MASS OR HSM,BS+ Rectal:  NOT DONE Extremities:  No clubbing, cyanosis, edema or deformities noted. Neurologic:  Alert and  oriented x4;  grossly normal neurologically. Psych:  Alert and cooperative. Normal mood and affect.   Impression & Recommendations:  Problem # 1:  ABDOMINAL PAIN, LEFT LOWER QUADRANT (ICD-789.04) Assessment New 49 YO FEMALE WITH  2 DAY HX OF INTENSE LLQ PAIN. ETIOLOGY IS NOT CLEAR. NO HX OF DIVERTICULOSIS-HOWEVER COLON WAS 5 YEARS AGO, NO HX URETEROLITHIASIS.   CHECK LABS AS BELOW SCHEDULE FOR CT ABD/PELVIS TODAY. ULTRAM 50 MG  Q 6 HOURS AS NEEDED FOR PAIN MAY STOP BENTYL IF NOT HELPING WILL CALL IN CIPRO AND FLAGYL X 10 DAYS  IN EVENT THIS IS DIVERTICULITIS-AWAIT CT.    *** ADDENDUM  -CT REPORT CALLED - LEFT OVARIAN CYST WITH SURROUNDING HEMORRHAGE,OTHERWISE NEGATIVE STUDY.  I SPOKE TO PT. SHE WILL MAKE APPT TO SEE HER GYN,USE ULTRAM AS NEEDED. CANCEL ANTIBIOTIC RX. Orders: CT Abdomen/Pelvis with Contrast (CT Abd/Pelvis w/con) TLB-CBC Platelet - w/Differential (85025-CBCD) TLB-Udip w/ Micro (81001-URINE)  Patient Instructions: 1)  You have been scheduled for a CT scan abdomen/pelvis with IV contrast today at [address], [address] ([place] [facility] department). Please arrive at 4:15 pm for your 4:30 pm appointment. 2)  Please go to the basement floor to have your labs drawn before leaving the office today. You  will have a CBC drawn and urinalysis completed. 3)  We have sent prescriptions for Ultram 50 mg to take every 6 hours as needed. We have also sent Flagyl 500 mg  for you to take two times a day x 10 days. We sent Cipro 500 mg for you to take two times a day x 10 days as well. 4)  Copy sent to : Dr Alroy Dust, Dr Rob Bunting, Dr Stan Head 5)  The medication list was reviewed and reconciled.  All changed / newly prescribed medications were explained.  A complete medication list was provided to the patient / caregiver. Prescriptions: CIPRO 500 MG TABS (CIPROFLOXACIN HCL) Take 1 tablet by mouth two times a day x 10 days  #20 x 0   Entered by:   Lowry Ram NCMA   Authorized by:   Sammuel Cooper PA-c   Signed by:   Lowry Ram NCMA on 01/22/2010   Method used:   Electronically to        [facility]* (retail)  [address] [place], [place]  [address]       Ph: [phone] or [phone]       Fax: [phone]   RxID:   [id number] FLAGYL 500 MG TABS (METRONIDAZOLE) Take 1 tablet by mouth two times a day x 10 days  #20 x 0   Entered by:   Lowry Ram NCMA   Authorized by:   Sammuel Cooper PA-c   Signed by:   Lowry Ram NCMA on 01/22/2010   Method used:   Electronically to        [facility]* (retail)       [address] [place], [place]  [address]       Ph: [phone] or [phone]       Fax: [phone]   RxID:   [id number] ULTRAM 50 MG TABS (TRAMADOL HCL) Take 1 tablet by mouth every 6 hours as needed  #30 x 0   Entered by:   Lowry Ram NCMA   Authorized by:   Sammuel Cooper PA-c   Signed by:   Lowry Ram NCMA on 01/22/2010   Method used:   Electronically to        [facility]* (retail)       [address] [place], [place]  [address]       Ph: [phone] or  [phone]       Fax: [phone]   RxID:   [id number]

## 2010-07-21 NOTE — Progress Notes (Signed)
Summary: sick  Phone Note Call from Patient Call back at Home Phone [phone]   Caller: Patient Call For: nadel Reason for Call: Talk to Nurse Summary of Call: pt has congestion in head, feels like moving down to chest, cough w/ yellow plegm(some sreaks of red)  Has been taking OTC meds, delsum, mucinex etc...  Some fever, doesn't want it to turn to PNA.  Light headed, cough getting worse.  Can she get something called in. [facility] Initial call taken by: Eugene Gavia,  August 13, 2009 1:26 PM  Follow-up for Phone Call        Spoke with pt.  She c/o prod cough with yellow- blood tinged sputum.  States that it is very hard to cough anything up.  She feels tight in her chest and has had low grade fever- onset was 5 days ago.  Wants rx called in.  Allergic to PCN, codiene and morphine.  Also wants tesslalon pearles.  Please advise thanks Vernie Murders  August 13, 2009 2:13 PM'  Additional Follow-up for Phone Call Additional follow up Details #1::        per SN----ok for pt to have avelox 400mg    #7  1 by mouth once daily until gone.  thanks Randell Loop CMA  August 13, 2009 3:32 PM     Additional Follow-up for Phone Call Additional follow up Details #2::    Spoke with pt and made aware that per SN- take avelox 400 mg x 7 days.  Rx was sent to [facility] Vernie Murders  August 13, 2009 3:43 PM   New/Updated Medications: AVELOX 400 MG TABS (MOXIFLOXACIN HCL) 1 by mouth once daily x 7 days Prescriptions: AVELOX 400 MG TABS (MOXIFLOXACIN HCL) 1 by mouth once daily x 7 days  #7 x 0   Entered by:   Vernie Murders   Authorized by:   Michele Mcalpine MD   Signed by:   Vernie Murders on 08/13/2009   Method used:   Electronically to        [facility]* (retail)       [address] [place], [place]  [address]       Ph: [phone] or [phone]       Fax: [phone]   RxID:   [id number]

## 2010-07-23 NOTE — Progress Notes (Signed)
Summary: cough  Phone Note Call from Patient Call back at [phone]   Caller: Patient Call For: nadel Summary of Call: cough isnt any better has one day left of zpak [facility]  Initial call taken by: Lacinda Axon,  June 05, 2010 1:40 PM  Follow-up for Phone Call        Spoke with pt.  She states started zpack on 12/14 and states that she is feeling only some better.  She states that she has been having violent coughing spells, cough is non prod and gets worse at night.  She states that she is also feeling soreness in her chest and back from cough.  She wants something called in to help with cough. Pls advise thanks allergic to codiene, morphine and PCN Follow-up by: Vernie Murders,  June 05, 2010 2:52 PM  Additional Follow-up for Phone Call Additional follow up Details #1::        Per SN- tussionex works best- call in # 4 oz, 1 tsp every 12 hrs as needed with 5 refills.  Spoke with pt and notified and called rx to pharm. Additional Follow-up by: Vernie Murders,  June 05, 2010 3:40 PM    New/Updated Medications: Sandria Senter ER 10-8 MG/5ML LQCR (HYDROCOD POLST-CHLORPHEN POLST) 1 tsp every 12 hrs as needed Prescriptions: TUSSIONEX PENNKINETIC ER 10-8 MG/5ML LQCR (HYDROCOD POLST-CHLORPHEN POLST) 1 tsp every 12 hrs as needed  #4 oz x 5   Entered by:   Vernie Murders   Authorized by:   Michele Mcalpine MD   Signed by:   Vernie Murders on 06/05/2010   Method used:   Telephoned to ...       [facility]* (retail)       [address] [place], [place]  [address]       Ph: [phone] or [phone]       Fax: [phone]   RxID:   [id number]

## 2010-07-23 NOTE — Progress Notes (Signed)
Summary: seen at [facility] last night  Phone Note Call from Patient   Caller: Patient Call For: dr Kriste Basque Summary of Call: patient phoned stated that she went to [facility] last night due to pain in her lower left side. They did a CT and found a lesion on her Kidney and they want her to have a kidney scan. She wants some info on the scan and wants to know if we will refer her of if we can order the scan. She stated that they told her it was 2 to 3 cm. They sent her home with a prescription for Vicodin and Zofran. They are not sure what the pain is coming from becasue they dont think the cyst is large enough to cause the problem. Patient can be reached at [phone] or [phone] or [phone] has the answering machine on it.  Initial call taken by: Vedia Coffer,  July 17, 2010 11:52 AM  Follow-up for Phone Call        Spoke with pt.  She states went to [facility] ED last night with left side pain.  She states that she had CT "from lung to pelvis" that showed a small lesion on her kidney.  She was advised that her PCP needed to set her up for a kidney scan. She states that she is still having some pain, vicodin helps some and so does warm heat.  Does not know where she needs to proceed from here.  Pls advise.  Pt requests a call back this pm. Thanks Follow-up by: Vernie Murders,  July 17, 2010 3:01 PM  Additional Follow-up for Phone Call Additional follow up Details #1::        pt stated that she does have copy of the disc of the ct scan Randell Loop [facility]  July 17, 2010 4:42 PM     Additional Follow-up for Phone Call Additional follow up Details #2::    called and spoke with pt and she is aware per SN that  she will need to follow up with urology for this kidney scan---will send in referral to urology for pt and pt is aware that this will be on monday before she may hear anything back.. pt is aware of this Randell Loop CMA  July 17, 2010 5:29 PM

## 2010-07-23 NOTE — Progress Notes (Signed)
Summary: requesting rx  Phone Note Call from Patient Call back at [phone]   Caller: Patient Call For: nadel Reason for Call: Talk to Nurse Summary of Call: Patient requesting abx.  Patient hoarse, head cold, cough.  Patient afriad that it will move to chest and she will get pnuemonia.  [facility] Initial call taken by: Lehman Prom,  June 03, 2010 4:24 PM  Follow-up for Phone Call        called and spoke with pt and she is stated that this started on sunday---with the hoarsness, sore throat, nasal drainage--hard to sleep at night---pt stated that zpak worked well the last time she had this----was given avelox 07/2009---please advise  Leigh Adkins CMA  June 03, 2010 4:33 PM    allergies---PCN, MORPHINE, CODEINE     Appended Document: requesting rx signed the phone note from earlier message---ok per SN to have zpak.  pt is aware that this has been sent to pts pharmacy Leigh Adkins CMA  June 03, 2010 5:14 PM    Clinical Lists Changes  Medications: Added new medication of ZITHROMAX Z-PAK 250 MG TABS (AZITHROMYCIN) take as directed - Signed Rx of ZITHROMAX Z-PAK 250 MG TABS (AZITHROMYCIN) take as directed;  #1 pak x 0;  Signed;  Entered by: Leigh Adkins CMA;  Authorized by: Scott M Nadel MD;  Method used: Electronically to [facility] [address], [place], [place],   [address], Ph: [phone] or [phone], Fax: [phone]    Prescriptions: ZITHROMAX Z-PAK 250 MG TABS (AZITHROMYCIN) take as directed  #1 pak x 0   Entered by:   Leigh Adkins CMA   Authorized by:   Scott M Nadel MD   Signed by:   Leigh Adkins CMA on 06/03/2010   Method used:   Electronically to        [facility] (retail)       [address] [place] [address] [place], [place]  [address]       Ph: [phone] or [phone]       Fax: [phone]   RxID:   [id number]

## 2010-08-07 ENCOUNTER — Telehealth: Payer: Self-pay | Admitting: Pulmonary Disease

## 2010-08-10 ENCOUNTER — Encounter: Payer: Self-pay | Admitting: Adult Health

## 2010-08-10 ENCOUNTER — Ambulatory Visit (INDEPENDENT_AMBULATORY_CARE_PROVIDER_SITE_OTHER): Payer: 59 | Admitting: Adult Health

## 2010-08-10 ENCOUNTER — Other Ambulatory Visit: Payer: Self-pay | Admitting: Pulmonary Disease

## 2010-08-10 ENCOUNTER — Ambulatory Visit (INDEPENDENT_AMBULATORY_CARE_PROVIDER_SITE_OTHER)
Admission: RE | Admit: 2010-08-10 | Discharge: 2010-08-10 | Disposition: A | Discharge status: Home / Self Care | Payer: 59 | Source: Ambulatory Visit | Attending: Pulmonary Disease | Admitting: Pulmonary Disease

## 2010-08-10 DIAGNOSIS — J189 Pneumonia, unspecified organism: Secondary | ICD-10-CM

## 2010-08-12 NOTE — Progress Notes (Signed)
Summary: requests zpac / congestion  Phone Note Call from Patient   Caller: Patient Call For: Weslie Pretlow Summary of Call: pt c/o itchy/ scratchy throat  x 1 day. has also had chills "off and on" as well. this started yesterday w/ watery eyes and runny nose. also states that it "kinda hurts to breathe in".  pt is caring for her mother (who just got out of the hospital) and requests a zpac. [facility]. pt # [phone] Initial call taken by: Tivis Ringer, CNA,  August 07, 2010 4:19 PM  Follow-up for Phone Call        per SN---ok for pt to have zpak  #1   take as directed with no refills. pt is aware that this has been sent to the pharmacy Randell Loop [facility]  August 07, 2010 5:12 PM     Prescriptions: ZITHROMAX Z-PAK 250 MG TABS (AZITHROMYCIN) take as directed  #1 pack x 0   Entered by:   Randell Loop CMA   Authorized by:   Michele Mcalpine MD   Signed by:   Randell Loop CMA on 08/07/2010   Method used:   Electronically to        [facility]* (retail)       [address] [address] [place], [place]  [address]       Ph: [phone] or [phone]       Fax: [phone]   RxID:   [id number]

## 2010-08-17 ENCOUNTER — Encounter: Payer: Self-pay | Admitting: Adult Health

## 2010-08-17 ENCOUNTER — Ambulatory Visit (INDEPENDENT_AMBULATORY_CARE_PROVIDER_SITE_OTHER)
Admission: RE | Admit: 2010-08-17 | Discharge: 2010-08-17 | Disposition: A | Discharge status: Home / Self Care | Payer: 59 | Source: Ambulatory Visit | Attending: Pulmonary Disease | Admitting: Pulmonary Disease

## 2010-08-17 ENCOUNTER — Other Ambulatory Visit: Payer: Self-pay | Admitting: Pulmonary Disease

## 2010-08-17 ENCOUNTER — Ambulatory Visit (INDEPENDENT_AMBULATORY_CARE_PROVIDER_SITE_OTHER): Payer: 59 | Admitting: Adult Health

## 2010-08-17 DIAGNOSIS — J189 Pneumonia, unspecified organism: Secondary | ICD-10-CM

## 2010-08-18 NOTE — Assessment & Plan Note (Signed)
Summary: Acute NP office visit - ?PNA   Copy to:  Alroy Dust, MD Primary Provider/Referring Provider:  Alroy Dust, MD  CC:  upper back pain, prod cough w/ bright red sputum x3-4, wheezing, increased SOB, dizziness, and night sweats x4days - was given zpak on 2.17.12.  History of Present Illness: 50 year old obese female with known history of AB, FM  August 10, 2010 --Presents for an acute office visit. Complains of upper back pain, prod cough w/ bright red sputum x3-4, wheezing, increased SOB, dizziness, night sweats x4days - was given zpak on 2.17.12 without any improvement.Complains that cough began to get worse last 2 days, now has some bright red blood specks mixed in her mucus. Pain with cough esp in upper back. Appetite is okay,no n/v/d. No fever or chills.Denies orthopnea, hemoptysis, fever, n/v/d, edema, headache,recent travel.    Medications Prior to Update: 1)  Ambien 10 Mg Tabs (Zolpidem Tartrate) .... Take 1 Tab By Mouth At Bedtime As Needed For Sleep.Marland KitchenMarland Kitchen 2)  Bentyl 20 Mg Tabs (Dicyclomine Hcl) .... Take 1 Tab By Mouth Every 4 Hours As Needed For Abdominal Cramping 3)  Ultram 50 Mg Tabs (Tramadol Hcl) .... Take 1 Tablet By Mouth Every 6 Hours As Needed 4)  Flagyl 500 Mg Tabs (Metronidazole) .... Take 1 Tablet By Mouth Two Times A Day X 10 Days 5)  Cipro 500 Mg Tabs (Ciprofloxacin Hcl) .... Take 1 Tablet By Mouth Two Times A Day X 10 Days 6)  Zithromax Z-Pak 250 Mg Tabs (Azithromycin) .... Take As Directed 7)  Tussionex Pennkinetic Er 10-8 Mg/47ml Lqcr (Hydrocod Polst-Chlorphen Polst) .Marland Kitchen.. 1 Tsp Every 12 Hrs As Needed  Allergies (verified): 1)  ! Morphine 2)  ! Penicillin 3)  ! Codeine  Past History:  Past Medical History: Last updated: 01/22/2010  ALLERGIC RHINITIS (ICD-477.9) COUGH VARIANT ASTHMA (ICD-493.82) Hx of PNEUMONIA (ICD-486) OBESITY (ICD-278.00) IRRITABLE BOWEL SYNDROME (ICD-564.1) HEMORRHOIDS (ICD-455.6) Hx of ENDOMETRIOSIS (ICD-617.9) FIBROMYALGIA  (ICD-729.1) VITAMIN D DEFICIENCY (ICD-268.9)  ANXIETY (ICD-300.00) INSOMNIA (ICD-780.52)  Past Surgical History: Last updated: 01/22/2010 S/P TAH in 1998 for endometriosis  Family History: Last updated: 01/22/2010 Father - Alive -Age 47 -Emphysema, Heart Disease, Arthritis Mother - Alive -Age 93 - asthma, Arthritis PGF - Prostate, Colon CA (?) Brother - Alive Age 41 Sister - Alive Age 50 Family History of Crohn's:Aunt Family History of Stomach Cancer: Aunt Family History of Diabetes: Aunt  Social History: Last updated: 01/22/2010 Married - husb Sonny [place] 3 children (girls) Non Development worker, community Alcohol Use - yes-socially Daily Caffeine Use Illicit Drug Use - no Patient does not get regular exercise.   Risk Factors: Smoking Status: never (05/22/2008)  Review of Systems      See HPI  Vital Signs:  Patient profile:   50 year old female Height:      64 inches Weight:      233 pounds BMI:     40.14 O2 Sat:      95 % on Room air Temp:     99.0 degrees F oral Pulse rate:   69 / minute BP sitting:   132 / 78  (left arm) Cuff size:   large  Vitals Entered By: Boone Master CNA/MA (August 10, 2010 10:41 AM)  O2 Flow:  Room air CC: upper back pain, prod cough w/ bright red sputum x3-4, wheezing, increased SOB, dizziness, night sweats x4days - was given zpak on 2.17.12 Is Patient Diabetic? No Comments Medications reviewed with patient Daytime contact number verified  with patient. Boone Master CNA/MA  August 10, 2010 10:39 AM    Physical Exam  Additional Exam:  WD, Obese, 50 y/o WF in NAD... GENERAL:  Alert & oriented; pleasant & cooperative... HEENT:  [place]/AT, EOM-wnl, PERRLA, EACs-clear, TMs-wnl, NOSE-clear drainge THROAT-clear & wnl. NECK:  Supple w/ full ROM; no JVD; normal carotid impulses w/o bruits; no thyromegaly or nodules palpated; no lymphadenopathy. CHEST:  Coarse BS w/ basialr crackles on left  HEART:  Regular Rhythm; without  murmurs/ rubs/ or gallops detected... ABDOMEN:  Obese, soft & nontender; normal bowel sounds; no organomegaly or masses palpated... EXT: without deformities or arthritic changes; no varicose veins/ venous insuffic/ or edema. NEURO:  no focal neuro deficits... DERM:  no lesions or rash...     Impression & Recommendations:  Problem # 1:  Hx of PNEUMONIA (ICD-486)  Left side PNA on xray Plan:  Rocephin 1 gram injection in office  Avelox 400mg  once daily  Mucinex DM two times a day as needed cough/congestion  fluids and rest  Tylenol as needed follow up 1 week and as needed  Please contact office for sooner follow up if symptoms do not improve or worsen   Orders: T-2 View CXR (71020TC) Rocephin  250mg  (W0981) Admin of Therapeutic Inj  intramuscular or subcutaneous (19147) Est. Patient Level IV (82956)  Medications Added to Medication List This Visit: 1)  Avelox 400 Mg Tabs (Moxifloxacin hcl) .Marland Kitchen.. 1 by mouth once daily  Patient Instructions: 1)  Rocephin 1 gram injection in office  2)  Avelox 400mg  once daily  3)  Mucinex DM two times a day as needed cough/congestion  4)  fluids and rest  5)  Tylenol as needed 6)  follow up 1 week and as needed  7)  Please contact office for sooner follow up if symptoms do not improve or worsen  Prescriptions: AVELOX 400 MG TABS (MOXIFLOXACIN HCL) 1 by mouth once daily  #7 x 0   Entered and Authorized by:   Rubye Oaks NP   Signed by:   Tammy Parrett NP on 08/10/2010   Method used:   Electronically to        [facility]* (retail)       [address] [place], [place]  [address]       Ph: [phone] or [phone]       Fax: [phone]   RxID:   [id number]       Medication Administration  Injection # 1:    Medication: Rocephin  250mg     Diagnosis: Hx of PNEUMONIA (ICD-486)    Route: IM    Site: RUOQ gluteus    Exp Date: 10-2011    Lot #: [id number]    Mfr:  novaplus    Patient tolerated injection without complications    Given by: Boone Master CNA/MA (August 10, 2010 12:41 PM)  Orders Added: 1)  T-2 View CXR [71020TC] 2)  Rocephin  250mg  [J0696] 3)  Admin of Therapeutic Inj  intramuscular or subcutaneous [96372] 4)  Est. Patient Level IV [18841]

## 2010-08-18 NOTE — Consult Note (Signed)
Summary: Urology/[facility]   Imported By: Sherian Rein 08/11/2010 11:26:17  _____________________________________________________________________  External Attachment:    Type:   Image     Comment:   External Document

## 2010-08-27 NOTE — Assessment & Plan Note (Signed)
Summary: NP follow up - PNA   Copy to:  Alroy Dust, MD Primary Provider/Referring Provider:  Alroy Dust, MD  CC:  1 week follow up - states breathing has improved since last ov, but still having prod cough with light yellow/white mucus, increased SOB, and weakness.  pt finish avelox today.  History of Present Illness: 50 year old obese female with known history of AB, FM  August 10, 2010 --Presents for an acute office visit. Complains of upper back pain, prod cough w/ bright red sputum x3-4, wheezing, increased SOB, dizziness, night sweats x4days - was given zpak on 2.17.12 without any improvement.Complains that cough began to get worse last 2 days, now has some bright red blood specks mixed in her mucus. Pain with cough esp in upper back. Appetite is okay,no n/v/d. No fever or chills.>>[place] PNA --  August 17, 2010--Returns for follow up of Pneumonia.  Last visit with acute respiratory symptoms with  [place] PNA on xray. Tx w/ Rocephin IM 1gram and Avelox x 7days. SHe has last dose of Avelox today. She is doing better with improved appetite. Still feels very weak wtih no energy.  Still has some cough but is better. NO fever , no n/v/d.Denies chest pain, orthopnea, hemoptysis, fever, n/v/d, edema, headache. Xray today shows decreased opacity.   Medications Prior to Update: 1)  Ambien 10 Mg Tabs (Zolpidem Tartrate) .... Take 1 Tab By Mouth At Bedtime As Needed For Sleep.Marland KitchenMarland Kitchen 2)  Ultram 50 Mg Tabs (Tramadol Hcl) .... Take 1 Tablet By Mouth Every 6 Hours As Needed 3)  Zithromax Z-Pak 250 Mg Tabs (Azithromycin) .... Take As Directed 4)  Tussionex Pennkinetic Er 10-8 Mg/68ml Lqcr (Hydrocod Polst-Chlorphen Polst) .Marland Kitchen.. 1 Tsp Every 12 Hrs As Needed 5)  Avelox 400 Mg Tabs (Moxifloxacin Hcl) .Marland Kitchen.. 1 By Mouth Once Daily  Current Medications (verified): 1)  Ambien 10 Mg Tabs (Zolpidem Tartrate) .... Take 1 Tab By Mouth At Bedtime As Needed For Sleep.Marland KitchenMarland Kitchen 2)  Ultram 50 Mg Tabs (Tramadol Hcl) .... Take 1  Tablet By Mouth Every 6 Hours As Needed 3)  Tussionex Pennkinetic Er 10-8 Mg/59ml Lqcr (Hydrocod Polst-Chlorphen Polst) .Marland Kitchen.. 1 Tsp Every 12 Hrs As Needed 4)  Avelox 400 Mg Tabs (Moxifloxacin Hcl) .Marland Kitchen.. 1 By Mouth Once Daily  Allergies (verified): 1)  ! Morphine 2)  ! Penicillin 3)  ! Codeine  Past History:  Past Medical History: Last updated: 01/22/2010  ALLERGIC RHINITIS (ICD-477.9) COUGH VARIANT ASTHMA (ICD-493.82) Hx of PNEUMONIA (ICD-486) OBESITY (ICD-278.00) IRRITABLE BOWEL SYNDROME (ICD-564.1) HEMORRHOIDS (ICD-455.6) Hx of ENDOMETRIOSIS (ICD-617.9) FIBROMYALGIA (ICD-729.1) VITAMIN D DEFICIENCY (ICD-268.9)  ANXIETY (ICD-300.00) INSOMNIA (ICD-780.52)  Past Surgical History: Last updated: 01/22/2010 S/P TAH in 1998 for endometriosis  Family History: Last updated: 01/22/2010 Father - Alive -Age 78 -Emphysema, Heart Disease, Arthritis Mother - Alive -Age 90 - asthma, Arthritis PGF - Prostate, Colon CA (?) Brother - Alive Age 30 Sister - Alive Age 105 Family History of Crohn's:Aunt Family History of Stomach Cancer: Aunt Family History of Diabetes: Aunt  Social History: Last updated: 01/22/2010 Married - husb 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   Risk Factors: Smoking Status: never (05/22/2008)  Review of Systems      See HPI  Vital Signs:  Patient profile:   50 year old female Height:      64 inches Weight:      233.25 pounds  BMI:     40.18 O2 Sat:      96 % on Room air Temp:     98.5 degrees F oral Pulse rate:   63 / minute BP sitting:   110 / 82  (left arm) Cuff size:   large  Vitals Entered By: Boone Master CNA/MA (August 17, 2010 11:13 AM)  O2 Flow:  Room air CC: 1 week follow up - states breathing has improved since last ov, but still having prod cough with light yellow/white mucus, increased SOB,  weakness.  pt finish avelox today Is Patient Diabetic? No Comments Medications reviewed with patient Daytime contact number verified with patient. Boone Master CNA/MA  August 17, 2010 11:12 AM    Physical Exam  Additional Exam:  WD, Obese, 50 y/o WF in NAD... GENERAL:  Alert & oriented; pleasant & cooperative... HEENT:  [place]/AT, EOM-wnl, PERRLA, EACs-clear, TMs-wnl, NOSE-clear drainge THROAT-clear & wnl. NECK:  Supple w/ full ROM; no JVD; normal carotid impulses w/o bruits; no thyromegaly or nodules palpated; no lymphadenopathy. CHEST:  Coarse BS w/ no wheezing or crackles  HEART:  Regular Rhythm; without murmurs/ rubs/ or gallops detected... ABDOMEN:  Obese, soft & nontender; normal bowel sounds; no organomegaly or masses palpated... EXT: without deformities or arthritic changes; no varicose veins/ venous insuffic/ or edema. NEURO:  no focal neuro deficits... DERM:  no lesions or rash...     Impression & Recommendations:  Problem # 1:  Hx of PNEUMONIA (ICD-486) Improving -clinically and radiographically Plan:  Finish Avelox today. Mucinex DM two times a day as needed cough/congestion  fluids and rest  Tylenol as needed  Please contact office for sooner follow up if symptoms do not improve or worsen  follow up Dr. Kriste Basque in 6-8 weeks  The following medications were removed from the medication list:    Zithromax Z-pak 250 Mg Tabs (Azithromycin) .Marland Kitchen... Take as directed Her updated medication list for this problem includes:    Avelox 400 Mg Tabs (Moxifloxacin hcl) .Marland Kitchen... 1 by mouth once daily  Orders: T-2 View CXR (71020TC) Est. Patient Level III (16[address])  Patient Instructions: 1)  Finish Avelox today. 2)  Mucinex DM two times a day as needed cough/congestion  3)  fluids and rest  4)  Tylenol as needed 5)   Please contact office for sooner follow up if symptoms do not improve or worsen  6)  follow up Dr. Kriste Basque in 6-8 weeks

## 2015-04-07 ENCOUNTER — Encounter: Payer: Self-pay | Admitting: Gastroenterology

## 2018-07-07 ENCOUNTER — Emergency Department (HOSPITAL_COMMUNITY): Payer: No Typology Code available for payment source

## 2018-07-07 ENCOUNTER — Encounter (HOSPITAL_COMMUNITY): Payer: Self-pay | Admitting: Emergency Medicine

## 2018-07-07 ENCOUNTER — Emergency Department (HOSPITAL_COMMUNITY)
Admission: EM | Admit: 2018-07-07 | Discharge: 2018-07-07 | Disposition: A | Discharge status: Home / Self Care | Payer: No Typology Code available for payment source | Attending: Emergency Medicine | Admitting: Emergency Medicine

## 2018-07-07 DIAGNOSIS — M79602 Pain in left arm: Secondary | ICD-10-CM | POA: Diagnosis not present

## 2018-07-07 DIAGNOSIS — Y999 Unspecified external cause status: Secondary | ICD-10-CM | POA: Insufficient documentation

## 2018-07-07 DIAGNOSIS — Y9389 Activity, other specified: Secondary | ICD-10-CM | POA: Insufficient documentation

## 2018-07-07 DIAGNOSIS — M25512 Pain in left shoulder: Secondary | ICD-10-CM | POA: Insufficient documentation

## 2018-07-07 DIAGNOSIS — Y92410 Unspecified street and highway as the place of occurrence of the external cause: Secondary | ICD-10-CM | POA: Diagnosis not present

## 2018-07-07 DIAGNOSIS — R51 Headache: Secondary | ICD-10-CM | POA: Diagnosis not present

## 2018-07-07 DIAGNOSIS — S161XXA Strain of muscle, fascia and tendon at neck level, initial encounter: Secondary | ICD-10-CM | POA: Diagnosis not present

## 2018-07-07 DIAGNOSIS — M25552 Pain in left hip: Secondary | ICD-10-CM | POA: Diagnosis not present

## 2018-07-07 DIAGNOSIS — S199XXA Unspecified injury of neck, initial encounter: Secondary | ICD-10-CM | POA: Diagnosis present

## 2018-07-07 HISTORY — DX: Unspecified pre-eclampsia, unspecified trimester: O14.90

## 2018-07-07 MED ORDER — METHOCARBAMOL 500 MG PO TABS: 500.0000 mg | ORAL_TABLET | Freq: Two times a day (BID) | ORAL | 0 refills | Status: AC

## 2018-07-07 MED ORDER — ACETAMINOPHEN 325 MG PO TABS: 650.0000 mg | ORAL_TABLET | Freq: Four times a day (QID) | ORAL | 0 refills | Status: AC | PRN
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
## 2020-07-31 ENCOUNTER — Encounter: Payer: Self-pay | Admitting: Gastroenterology
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
